# Patient Record
Sex: Male | Born: 1995
Health system: Southern US, Community
[De-identification: ages and names within clinical notes are randomized; demographics above are authoritative.]

## PROBLEM LIST (undated history)

## (undated) DIAGNOSIS — G43909 Migraine, unspecified, not intractable, without status migrainosus: Secondary | ICD-10-CM

## (undated) HISTORY — DX: Migraine, unspecified, not intractable, without status migrainosus: G43.909

---
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
## 2015-01-06 ENCOUNTER — Ambulatory Visit: Payer: 59 | Admitting: Neurology

## 2015-03-22 ENCOUNTER — Encounter: Payer: Self-pay | Admitting: *Deleted

## 2015-03-30 ENCOUNTER — Ambulatory Visit: Payer: 59 | Admitting: Neurology

## 2015-12-23 MED FILL — ATENOLOL 25 MG TABLET: 25 | 90 days supply | Qty: 90 | Fill #3

## 2016-06-04 MED FILL — ATENOLOL 25 MG TABLET: 25 | 90 days supply | Qty: 90 | Fill #0

## 2016-06-07 DIAGNOSIS — Q8740 Marfan's syndrome, unspecified: Secondary | ICD-10-CM | POA: Diagnosis not present

## 2016-06-07 DIAGNOSIS — H5213 Myopia, bilateral: Secondary | ICD-10-CM | POA: Diagnosis not present

## 2016-06-07 DIAGNOSIS — I77810 Thoracic aortic ectasia: Secondary | ICD-10-CM | POA: Diagnosis not present

## 2016-07-06 MED FILL — ATENOLOL 25 MG TABLET: 25 | 14 days supply | Qty: 14 | Fill #1

## 2016-11-08 MED FILL — HYDROCODON-APAP 7.5-325: 7.5-325 | 4 days supply | Qty: 20 | Fill #0

## 2016-11-08 MED FILL — PROMETHAZINE 25 MG TABLET: 25 | 3 days supply | Qty: 10 | Fill #0

## 2016-11-09 MED FILL — ATENOLOL 25 MG TABLET: 25 | 30 days supply | Qty: 30 | Fill #2

## 2017-02-25 MED FILL — ATENOLOL 25 MG TABLET: 25 | 30 days supply | Qty: 30 | Fill #3

## 2017-03-26 MED FILL — ATENOLOL 25 MG TABLET: 25 | 90 days supply | Qty: 90 | Fill #4

## 2017-05-30 DIAGNOSIS — Q8740 Marfan's syndrome, unspecified: Secondary | ICD-10-CM | POA: Diagnosis not present

## 2017-05-30 DIAGNOSIS — G43001 Migraine without aura, not intractable, with status migrainosus: Secondary | ICD-10-CM | POA: Diagnosis not present

## 2017-06-10 DIAGNOSIS — H5213 Myopia, bilateral: Secondary | ICD-10-CM | POA: Diagnosis not present

## 2017-06-27 MED FILL — ATENOLOL 25 MG TABLET: 25 | 90 days supply | Qty: 90 | Fill #0

## 2017-10-25 MED FILL — ATENOLOL 25 MG TABLET: 25 | 90 days supply | Qty: 90 | Fill #1

## 2018-02-13 MED FILL — ATENOLOL 25 MG TABLET: 25 | 90 days supply | Qty: 90 | Fill #2

## 2018-06-18 ENCOUNTER — Ambulatory Visit (INDEPENDENT_AMBULATORY_CARE_PROVIDER_SITE_OTHER): Payer: Self-pay | Admitting: Nurse Practitioner

## 2018-06-18 DIAGNOSIS — Z23 Encounter for immunization: Secondary | ICD-10-CM

## 2018-06-18 NOTE — Progress Notes (Signed)
Pt presents here today for visit to receive Tdap vaccine. Allergies reviewed, vaccine given, vaccine information statement provided, tolerated well.   

## 2018-06-23 DIAGNOSIS — H5213 Myopia, bilateral: Secondary | ICD-10-CM | POA: Diagnosis not present

## 2018-06-24 ENCOUNTER — Other Ambulatory Visit: Payer: Self-pay | Admitting: Pediatrics

## 2018-06-24 DIAGNOSIS — Q8740 Marfan's syndrome, unspecified: Secondary | ICD-10-CM

## 2018-06-24 DIAGNOSIS — I77810 Thoracic aortic ectasia: Secondary | ICD-10-CM | POA: Diagnosis not present

## 2018-08-14 MED FILL — ATENOLOL 25 MG TABLET: 25 | 90 days supply | Qty: 90 | Fill #0

## 2018-10-20 ENCOUNTER — Telehealth (HOSPITAL_COMMUNITY): Payer: Self-pay | Admitting: Emergency Medicine

## 2018-10-20 NOTE — Telephone Encounter (Signed)
RN NAV reaching out to patient to answer any questions he might have for MR on 10-22-18; no answer but left message on voicemail with name and callback number  Rockwell AlexandriaSara Daneen Volcy RN [phone]

## 2018-10-22 ENCOUNTER — Ambulatory Visit (HOSPITAL_COMMUNITY)
Admission: RE | Admit: 2018-10-22 | Discharge: 2018-10-22 | Disposition: A | Discharge status: Home / Self Care | Payer: 59 | Source: Ambulatory Visit | Attending: Pediatrics | Admitting: Pediatrics

## 2018-10-22 DIAGNOSIS — I77819 Aortic ectasia, unspecified site: Secondary | ICD-10-CM | POA: Diagnosis not present

## 2018-10-22 DIAGNOSIS — Q8740 Marfan's syndrome, unspecified: Secondary | ICD-10-CM

## 2018-10-22 DIAGNOSIS — Z8249 Family history of ischemic heart disease and other diseases of the circulatory system: Secondary | ICD-10-CM | POA: Insufficient documentation

## 2018-10-22 DIAGNOSIS — I351 Nonrheumatic aortic (valve) insufficiency: Secondary | ICD-10-CM | POA: Insufficient documentation

## 2018-10-22 MED ORDER — GADOBUTROL 1 MMOL/ML IV SOLN
6.5000 mL | Freq: Once | INTRAVENOUS | Status: AC | PRN
  Administered 2018-10-22: 6.5 mL via INTRAVENOUS

## 2019-01-06 DIAGNOSIS — J111 Influenza due to unidentified influenza virus with other respiratory manifestations: Secondary | ICD-10-CM | POA: Diagnosis not present

## 2019-01-06 DIAGNOSIS — Z6823 Body mass index (BMI) 23.0-23.9, adult: Secondary | ICD-10-CM | POA: Diagnosis not present

## 2019-01-31 MED FILL — ATENOLOL 25 MG TABLET: 25 | 90 days supply | Qty: 90 | Fill #0

## 2019-05-05 MED FILL — ATENOLOL 25 MG TABLET: 25 | 90 days supply | Qty: 90 | Fill #1

## 2019-07-24 DIAGNOSIS — Q8740 Marfan's syndrome, unspecified: Secondary | ICD-10-CM | POA: Diagnosis not present

## 2019-09-25 MED FILL — ATENOLOL 25 MG TABLET: 25 | 90 days supply | Qty: 90 | Fill #0

## 2020-02-13 ENCOUNTER — Ambulatory Visit: Payer: 59 | Attending: Internal Medicine

## 2020-02-13 DIAGNOSIS — Z23 Encounter for immunization: Secondary | ICD-10-CM

## 2020-02-13 NOTE — Progress Notes (Signed)
   Covid-19 Vaccination Clinic  Name:  Jeremy Myers    MRN: [id number] DOB: 07-23-1996  02/13/2020  Mr. Jeremy Myers was observed post Covid-19 immunization for 15 minutes without incident. He was provided with Vaccine Information Sheet and instruction to access the V-Safe system.   Mr. Jeremy Myers was instructed to call 911 with any severe reactions post vaccine: Marland Kitchen Difficulty breathing  . Swelling of face and throat  . A fast heartbeat  . A bad rash all over body  . Dizziness and weakness   Immunizations Administered    Name Date Dose VIS Date Route   Pfizer COVID-19 Vaccine 02/13/2020 12:07 PM 0.3 mL 10/16/2019 Intramuscular   Manufacturer: [employer], [employer]   Lot: [id number]   NDC: [address]-2

## 2020-02-22 MED FILL — ATENOLOL 25 MG TABLET: 25 | 90 days supply | Qty: 90 | Fill #1

## 2020-03-05 ENCOUNTER — Ambulatory Visit: Payer: 59 | Attending: Internal Medicine

## 2020-03-05 DIAGNOSIS — Z23 Encounter for immunization: Secondary | ICD-10-CM

## 2020-03-05 NOTE — Progress Notes (Signed)
   Covid-19 Vaccination Clinic  Name:  Jeremy Myers    MRN: [id number] DOB: 1996/08/27  03/05/2020  Jeremy Myers was observed post Covid-19 immunization for 15 minutes without incident. He was provided with Vaccine Information Sheet and instruction to access the V-Safe system.   Jeremy Myers was instructed to call 911 with any severe reactions post vaccine: Marland Kitchen Difficulty breathing  . Swelling of face and throat  . A fast heartbeat  . A bad rash all over body  . Dizziness and weakness   Immunizations Administered    Name Date Dose VIS Date Route   Pfizer COVID-19 Vaccine 03/05/2020 11:42 AM 0.3 mL 12/30/2018 Intramuscular   Manufacturer: [employer], [employer]   Lot: [id number]   NDC: [address]-5

## 2020-03-09 ENCOUNTER — Ambulatory Visit: Payer: 59

## 2020-03-15 DIAGNOSIS — Q8740 Marfan's syndrome, unspecified: Secondary | ICD-10-CM | POA: Diagnosis not present

## 2020-03-15 DIAGNOSIS — I351 Nonrheumatic aortic (valve) insufficiency: Secondary | ICD-10-CM | POA: Diagnosis not present

## 2020-05-13 IMAGING — MR MR CARD MORPHOLOGY WO/W CM
12 of 13 series · 15 of 16 positions shown · IV contrast (gadavist)
Comparison: none

CLINICAL DATA: Family history of Marfans syndrome, dilated aortic
root.

EXAM:
CARDIAC MRI
TECHNIQUE: The patient was scanned on a 1.5 Tesla GE magnet. A dedicated
cardiac coil was used. Functional imaging was done using Fiesta
sequences. [DATE], and 4 chamber views were done to assess for RWMA's.
Modified Alami rule using a short axis stack was used to
calculate an ejection fraction on a dedicated work station using
Circle software. The patient received 8 cc of Gadavist. After 10
minutes inversion recovery sequences were used to assess for
infiltration and scar tissue.

[Series 3: bSSFP · sagittal · 8.0mm · 1.41mm/px · 1 of 14 slices shown (1 of 7)]
[im 1/14]
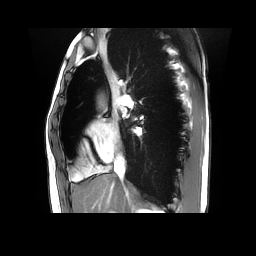

[Series 4: bSSFP · axial · 8.0mm · 1.37mm/px · 1 of 20 slices shown (2 of 7)]
[im 1/20]
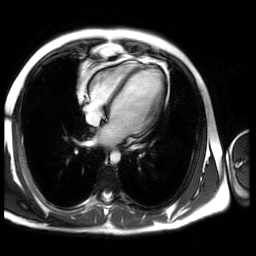

[Series 5: bSSFP · oblique · 8.0mm · 1.41mm/px · 2 of 300 slices shown (3 of 7)]
[im 1/300]
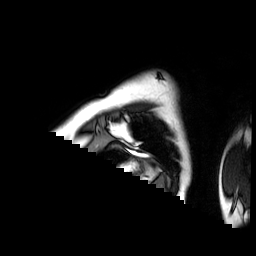
[im 300/300]
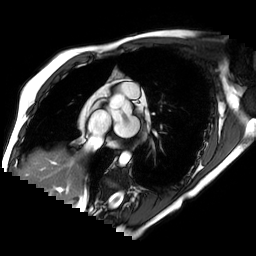

[Series 6: T1 · axial · 8.0mm · 1.25mm/px · 1 of 19 slices shown]
[im 1/19]
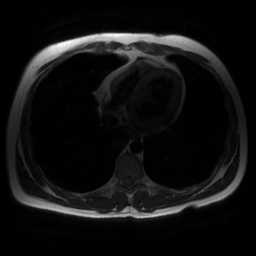

[Series 8: bSSFP · sagittal · 6.0mm · 1.37mm/px · 1 of 200 slices shown (4 of 7)]
[im 1/200]
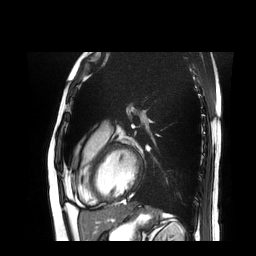

[Series 9: bSSFP · axial · 6.0mm · 0.68mm/px · z∈[-140,-82]mm · 3 of 260 slices shown (5 of 7)]
[im 1/260]
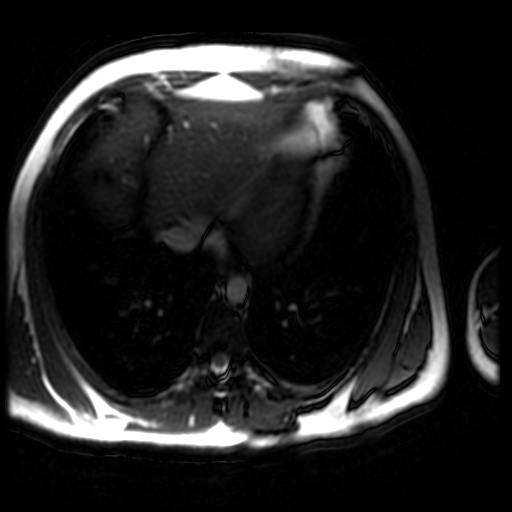
[im 130/260]
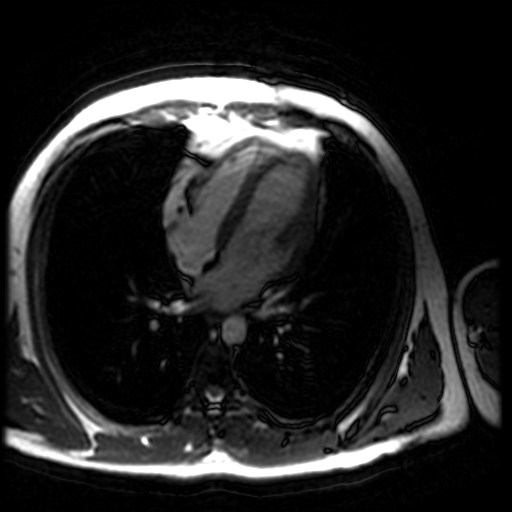
[im 260/260]
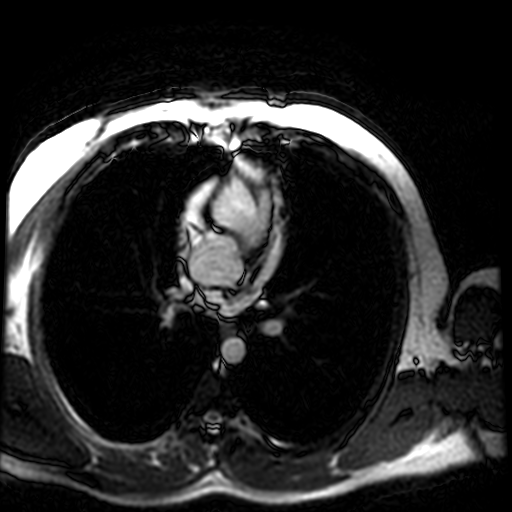

[Series 10: bSSFP · axial · 6.0mm · 1.33mm/px · 1 of 140 slices shown (6 of 7)]
[im 1/140]
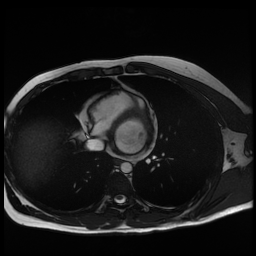

[Series 11: bSSFP · oblique · 8.0mm · 1.37mm/px · 1 of 60 slices shown (7 of 7)]
[im 1/60]
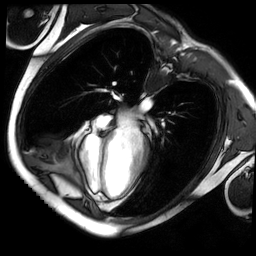

[Series 12: cine ir · oblique · 8.0mm · 1.64mm/px · 1 of 30 slices shown]
[im 1/30]
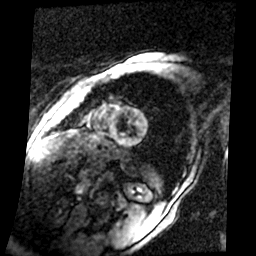

[Series 15: delayed ir prep · oblique · 8.0mm · 1.64mm/px · 1 of 13 slices shown]
[im 1/13]
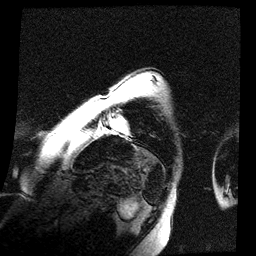

[Series 16: rad delayed ir · oblique · 8.0mm · 1.48mm/px · 1 of 3 slices shown (1 of 2)]
[im 1/3]
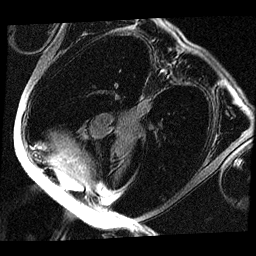

[Series 17: rad delayed ir · axial · 8.0mm · 1.37mm/px · 1 of 1 slices shown (2 of 2)]
[im 1/1]
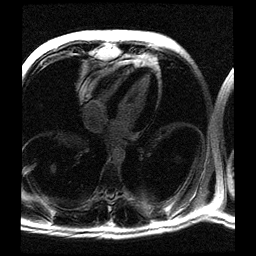

[15 of 16 positions shown; findings below may reference images not displayed]

FINDINGS: Limited images of the lung fields showed no gross abnormalities.

Normal left ventricular size and systolic function, EF 60%. Normal
wall thickness and wall motion. Normal right ventricular size and
systolic function, EF 63%. Normal left and right atrial sizes.
Trileaflet aortic valve with no stenosis, mild aortic insufficiency.
No significant mitral regurgitation. There does not appear to be
definite mitral valve prolapse.

On delayed enhancement imaging, there was no myocardial late
gadolinium enhancement (LGE).

Measurements:

Aortic root at sinuses of valsalva: 3.6 cm maximal

ST junction 3.3 cm

Ascending aorta 2.9 cm

LVEDV 163 mL

LVSV 98 mL
LVEF 60%

RVEDV 139 mL
RVSV 87 mL
RVEF 63%
IMPRESSION: 1.  Normal LV size and systolic function, EF 60%.

2.  Normal RV size and systolic function, EF 63%.

3. No myocardial LGE, so no definitive evidence for prior MI,
infiltrative disease, or myocarditis.

4.  Mildly dilated aortic root at sinuses of valsalva (3.6 cm).

5.  Trileaflet aortic valve with mild aortic insufficiency.

Erasmo Waid

## 2020-05-30 MED FILL — ATENOLOL 25 MG TABLET: 25 | 90 days supply | Qty: 90 | Fill #2

## 2020-08-29 MED FILL — ATENOLOL 25 MG TABLET: 25 | 90 days supply | Qty: 90 | Fill #3

## 2020-11-15 ENCOUNTER — Other Ambulatory Visit (HOSPITAL_COMMUNITY): Payer: Self-pay | Admitting: Pediatrics

## 2020-11-22 MED FILL — ATENOLOL 25 MG TABLET: 25 | 90 days supply | Qty: 90 | Fill #0

## 2021-02-13 ENCOUNTER — Other Ambulatory Visit (HOSPITAL_COMMUNITY): Payer: Self-pay

## 2021-02-13 MED FILL — Atenolol Tab 25 MG: ORAL | 90 days supply | Qty: 90 | Fill #0 | Status: AC

## 2021-03-24 DIAGNOSIS — S80811A Abrasion, right lower leg, initial encounter: Secondary | ICD-10-CM | POA: Diagnosis not present

## 2021-03-24 DIAGNOSIS — S80912A Unspecified superficial injury of left knee, initial encounter: Secondary | ICD-10-CM | POA: Diagnosis not present

## 2021-03-24 DIAGNOSIS — S299XXA Unspecified injury of thorax, initial encounter: Secondary | ICD-10-CM | POA: Diagnosis not present

## 2021-03-25 DIAGNOSIS — S42032A Displaced fracture of lateral end of left clavicle, initial encounter for closed fracture: Secondary | ICD-10-CM | POA: Diagnosis not present

## 2021-03-25 DIAGNOSIS — S83419A Sprain of medial collateral ligament of unspecified knee, initial encounter: Secondary | ICD-10-CM | POA: Diagnosis not present

## 2021-03-25 DIAGNOSIS — S27322A Contusion of lung, bilateral, initial encounter: Secondary | ICD-10-CM | POA: Diagnosis not present

## 2021-03-25 DIAGNOSIS — Z743 Need for continuous supervision: Secondary | ICD-10-CM | POA: Diagnosis not present

## 2021-03-25 DIAGNOSIS — S32302A Unspecified fracture of left ilium, initial encounter for closed fracture: Secondary | ICD-10-CM | POA: Diagnosis not present

## 2021-03-25 DIAGNOSIS — I77810 Thoracic aortic ectasia: Secondary | ICD-10-CM | POA: Diagnosis not present

## 2021-03-25 DIAGNOSIS — S270XXA Traumatic pneumothorax, initial encounter: Secondary | ICD-10-CM | POA: Diagnosis not present

## 2021-03-25 DIAGNOSIS — T07XXXA Unspecified multiple injuries, initial encounter: Secondary | ICD-10-CM | POA: Diagnosis not present

## 2021-03-25 DIAGNOSIS — S37051A Moderate laceration of right kidney, initial encounter: Secondary | ICD-10-CM | POA: Diagnosis not present

## 2021-03-25 DIAGNOSIS — S72432A Displaced fracture of medial condyle of left femur, initial encounter for closed fracture: Secondary | ICD-10-CM | POA: Diagnosis not present

## 2021-03-25 DIAGNOSIS — S36115A Moderate laceration of liver, initial encounter: Secondary | ICD-10-CM | POA: Diagnosis not present

## 2021-03-25 DIAGNOSIS — Z9989 Dependence on other enabling machines and devices: Secondary | ICD-10-CM | POA: Diagnosis not present

## 2021-03-29 ENCOUNTER — Other Ambulatory Visit: Payer: Self-pay | Admitting: *Deleted

## 2021-03-29 ENCOUNTER — Encounter: Payer: Self-pay | Admitting: *Deleted

## 2021-03-29 DIAGNOSIS — S37039A Laceration of unspecified kidney, unspecified degree, initial encounter: Secondary | ICD-10-CM | POA: Insufficient documentation

## 2021-03-29 DIAGNOSIS — S42002A Fracture of unspecified part of left clavicle, initial encounter for closed fracture: Secondary | ICD-10-CM | POA: Insufficient documentation
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
## 2021-04-11 DIAGNOSIS — S32302A Unspecified fracture of left ilium, initial encounter for closed fracture: Secondary | ICD-10-CM | POA: Diagnosis not present

## 2021-04-11 DIAGNOSIS — S72432A Displaced fracture of medial condyle of left femur, initial encounter for closed fracture: Secondary | ICD-10-CM | POA: Diagnosis not present

## 2021-04-11 DIAGNOSIS — S42032A Displaced fracture of lateral end of left clavicle, initial encounter for closed fracture: Secondary | ICD-10-CM | POA: Diagnosis not present

## 2021-05-04 ENCOUNTER — Other Ambulatory Visit (HOSPITAL_COMMUNITY): Payer: Self-pay

## 2021-05-04 MED FILL — Atenolol Tab 25 MG: ORAL | 90 days supply | Qty: 90 | Fill #1 | Status: AC

## 2021-05-16 DIAGNOSIS — H16133 Photokeratitis, bilateral: Secondary | ICD-10-CM | POA: Diagnosis not present

## 2021-05-19 DIAGNOSIS — H16133 Photokeratitis, bilateral: Secondary | ICD-10-CM | POA: Diagnosis not present

## 2021-05-29 DIAGNOSIS — S32302D Unspecified fracture of left ilium, subsequent encounter for fracture with routine healing: Secondary | ICD-10-CM | POA: Diagnosis not present

## 2021-06-09 DIAGNOSIS — H16133 Photokeratitis, bilateral: Secondary | ICD-10-CM | POA: Diagnosis not present

## 2021-06-09 DIAGNOSIS — H52223 Regular astigmatism, bilateral: Secondary | ICD-10-CM | POA: Diagnosis not present

## 2021-06-09 DIAGNOSIS — H5213 Myopia, bilateral: Secondary | ICD-10-CM | POA: Diagnosis not present

## 2021-07-28 DIAGNOSIS — I341 Nonrheumatic mitral (valve) prolapse: Secondary | ICD-10-CM | POA: Diagnosis not present

## 2021-08-09 ENCOUNTER — Other Ambulatory Visit (HOSPITAL_COMMUNITY): Payer: Self-pay

## 2021-08-09 MED FILL — Atenolol Tab 25 MG: ORAL | 90 days supply | Qty: 90 | Fill #2 | Status: AC

## 2021-08-14 DIAGNOSIS — Z79899 Other long term (current) drug therapy: Secondary | ICD-10-CM | POA: Diagnosis not present

## 2021-08-14 DIAGNOSIS — I080 Rheumatic disorders of both mitral and aortic valves: Secondary | ICD-10-CM | POA: Diagnosis not present

## 2021-11-23 ENCOUNTER — Other Ambulatory Visit (HOSPITAL_COMMUNITY): Payer: Self-pay

## 2021-11-24 ENCOUNTER — Other Ambulatory Visit (HOSPITAL_COMMUNITY): Payer: Self-pay

## 2021-11-24 MED ORDER — ATENOLOL 25 MG PO TABS
25.0000 mg | ORAL_TABLET | Freq: Every day | ORAL | 0 refills | Status: DC
  Filled 2021-11-24: qty 90, 90d supply, fill #0

## 2021-12-22 DIAGNOSIS — R059 Cough, unspecified: Secondary | ICD-10-CM | POA: Diagnosis not present

## 2021-12-22 DIAGNOSIS — J029 Acute pharyngitis, unspecified: Secondary | ICD-10-CM | POA: Diagnosis not present

## 2021-12-22 DIAGNOSIS — R0981 Nasal congestion: Secondary | ICD-10-CM | POA: Diagnosis not present

## 2021-12-26 DIAGNOSIS — R059 Cough, unspecified: Secondary | ICD-10-CM | POA: Diagnosis not present

## 2022-01-02 DIAGNOSIS — Z8709 Personal history of other diseases of the respiratory system: Secondary | ICD-10-CM | POA: Diagnosis not present

## 2022-01-02 DIAGNOSIS — Z09 Encounter for follow-up examination after completed treatment for conditions other than malignant neoplasm: Secondary | ICD-10-CM | POA: Diagnosis not present

## 2022-01-03 DIAGNOSIS — R053 Chronic cough: Secondary | ICD-10-CM | POA: Diagnosis not present

## 2022-01-03 DIAGNOSIS — Z8701 Personal history of pneumonia (recurrent): Secondary | ICD-10-CM | POA: Diagnosis not present

## 2022-02-13 DIAGNOSIS — Z79899 Other long term (current) drug therapy: Secondary | ICD-10-CM | POA: Diagnosis not present

## 2022-02-13 DIAGNOSIS — I77810 Thoracic aortic ectasia: Secondary | ICD-10-CM | POA: Diagnosis not present

## 2022-02-13 DIAGNOSIS — M25562 Pain in left knee: Secondary | ICD-10-CM | POA: Diagnosis not present

## 2022-02-22 ENCOUNTER — Other Ambulatory Visit (HOSPITAL_COMMUNITY): Payer: Self-pay

## 2022-02-22 MED ORDER — ATENOLOL 25 MG PO TABS
25.0000 mg | ORAL_TABLET | Freq: Every day | ORAL | 0 refills | Status: DC
  Filled 2022-02-22: qty 90, 90d supply, fill #0

## 2022-02-27 ENCOUNTER — Other Ambulatory Visit (HOSPITAL_COMMUNITY): Payer: Self-pay

## 2022-03-31 DIAGNOSIS — W458XXA Other foreign body or object entering through skin, initial encounter: Secondary | ICD-10-CM | POA: Diagnosis not present

## 2022-03-31 DIAGNOSIS — Y929 Unspecified place or not applicable: Secondary | ICD-10-CM | POA: Diagnosis not present

## 2022-03-31 DIAGNOSIS — Y9389 Activity, other specified: Secondary | ICD-10-CM | POA: Diagnosis not present

## 2022-03-31 DIAGNOSIS — S61211A Laceration without foreign body of left index finger without damage to nail, initial encounter: Secondary | ICD-10-CM | POA: Diagnosis not present

## 2022-05-22 DIAGNOSIS — M25562 Pain in left knee: Secondary | ICD-10-CM | POA: Diagnosis not present

## 2022-06-05 ENCOUNTER — Other Ambulatory Visit (HOSPITAL_COMMUNITY): Payer: Self-pay

## 2022-06-05 DIAGNOSIS — M25562 Pain in left knee: Secondary | ICD-10-CM | POA: Diagnosis not present

## 2022-06-05 MED ORDER — ATENOLOL 25 MG PO TABS
25.0000 mg | ORAL_TABLET | Freq: Every day | ORAL | 0 refills | Status: AC
  Filled 2022-06-05 – 2022-06-14 (×2): qty 90, 90d supply, fill #0

## 2022-06-12 ENCOUNTER — Other Ambulatory Visit (HOSPITAL_COMMUNITY): Payer: Self-pay

## 2022-06-13 ENCOUNTER — Other Ambulatory Visit (HOSPITAL_COMMUNITY): Payer: Self-pay

## 2022-06-13 DIAGNOSIS — H52223 Regular astigmatism, bilateral: Secondary | ICD-10-CM | POA: Diagnosis not present

## 2022-06-13 DIAGNOSIS — H5213 Myopia, bilateral: Secondary | ICD-10-CM | POA: Diagnosis not present

## 2022-06-14 ENCOUNTER — Other Ambulatory Visit (HOSPITAL_COMMUNITY): Payer: Self-pay

## 2022-06-20 DIAGNOSIS — M25562 Pain in left knee: Secondary | ICD-10-CM | POA: Diagnosis not present

## 2022-06-22 DIAGNOSIS — M25562 Pain in left knee: Secondary | ICD-10-CM | POA: Diagnosis not present

## 2022-06-25 DIAGNOSIS — M25562 Pain in left knee: Secondary | ICD-10-CM | POA: Diagnosis not present

## 2022-06-29 DIAGNOSIS — M25562 Pain in left knee: Secondary | ICD-10-CM | POA: Diagnosis not present

## 2022-07-05 DIAGNOSIS — M25562 Pain in left knee: Secondary | ICD-10-CM | POA: Diagnosis not present

## 2022-07-06 DIAGNOSIS — M25562 Pain in left knee: Secondary | ICD-10-CM | POA: Diagnosis not present

## 2022-07-11 DIAGNOSIS — M25562 Pain in left knee: Secondary | ICD-10-CM | POA: Diagnosis not present

## 2022-07-13 DIAGNOSIS — M25562 Pain in left knee: Secondary | ICD-10-CM | POA: Diagnosis not present

## 2022-07-16 DIAGNOSIS — M25562 Pain in left knee: Secondary | ICD-10-CM | POA: Diagnosis not present

## 2022-07-17 DIAGNOSIS — Z8781 Personal history of (healed) traumatic fracture: Secondary | ICD-10-CM | POA: Diagnosis not present

## 2022-07-17 DIAGNOSIS — M25562 Pain in left knee: Secondary | ICD-10-CM | POA: Diagnosis not present

## 2022-07-20 DIAGNOSIS — M25562 Pain in left knee: Secondary | ICD-10-CM | POA: Diagnosis not present

## 2022-07-24 DIAGNOSIS — M25562 Pain in left knee: Secondary | ICD-10-CM | POA: Diagnosis not present

## 2022-07-27 DIAGNOSIS — M25562 Pain in left knee: Secondary | ICD-10-CM | POA: Diagnosis not present

## 2022-09-13 ENCOUNTER — Other Ambulatory Visit (HOSPITAL_COMMUNITY): Payer: Self-pay

## 2022-09-17 ENCOUNTER — Other Ambulatory Visit (HOSPITAL_COMMUNITY): Payer: Self-pay

## 2022-09-17 MED ORDER — ATENOLOL 25 MG PO TABS
25.0000 mg | ORAL_TABLET | Freq: Every day | ORAL | 3 refills | Status: AC
  Filled 2022-09-17: qty 90, 90d supply, fill #0
  Filled 2023-01-08: qty 90, 90d supply, fill #1

## 2022-10-05 DIAGNOSIS — Z0000 Encounter for general adult medical examination without abnormal findings: Secondary | ICD-10-CM | POA: Diagnosis not present

## 2022-10-05 DIAGNOSIS — I341 Nonrheumatic mitral (valve) prolapse: Secondary | ICD-10-CM | POA: Diagnosis not present

## 2022-10-05 DIAGNOSIS — Z113 Encounter for screening for infections with a predominantly sexual mode of transmission: Secondary | ICD-10-CM | POA: Diagnosis not present

## 2022-10-05 DIAGNOSIS — Q2543 Congenital aneurysm of aorta: Secondary | ICD-10-CM | POA: Diagnosis not present

## 2023-01-08 ENCOUNTER — Other Ambulatory Visit (HOSPITAL_COMMUNITY): Payer: Self-pay
